# Patient Record
Sex: Male | Born: 1995 | Race: Black or African American | Hispanic: No | Marital: Single | Smoking: Never smoker
Health system: Southern US, Community
[De-identification: ages and names within clinical notes are randomized; demographics above are authoritative.]

## PROBLEM LIST (undated history)

## (undated) DIAGNOSIS — F909 Attention-deficit hyperactivity disorder, unspecified type: Secondary | ICD-10-CM

---
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
## 2016-09-25 IMAGING — CR DG ANKLE COMPLETE 3+V*R*
3 series · 3 of 3 positions shown · non-contrast
Comparison: None.

CLINICAL DATA: Right ankle pain post injury while playing
basketball, rolled ankle, pain and swelling

EXAM:
RIGHT ANKLE - COMPLETE 3+ VIEW

[x ankle ap right]
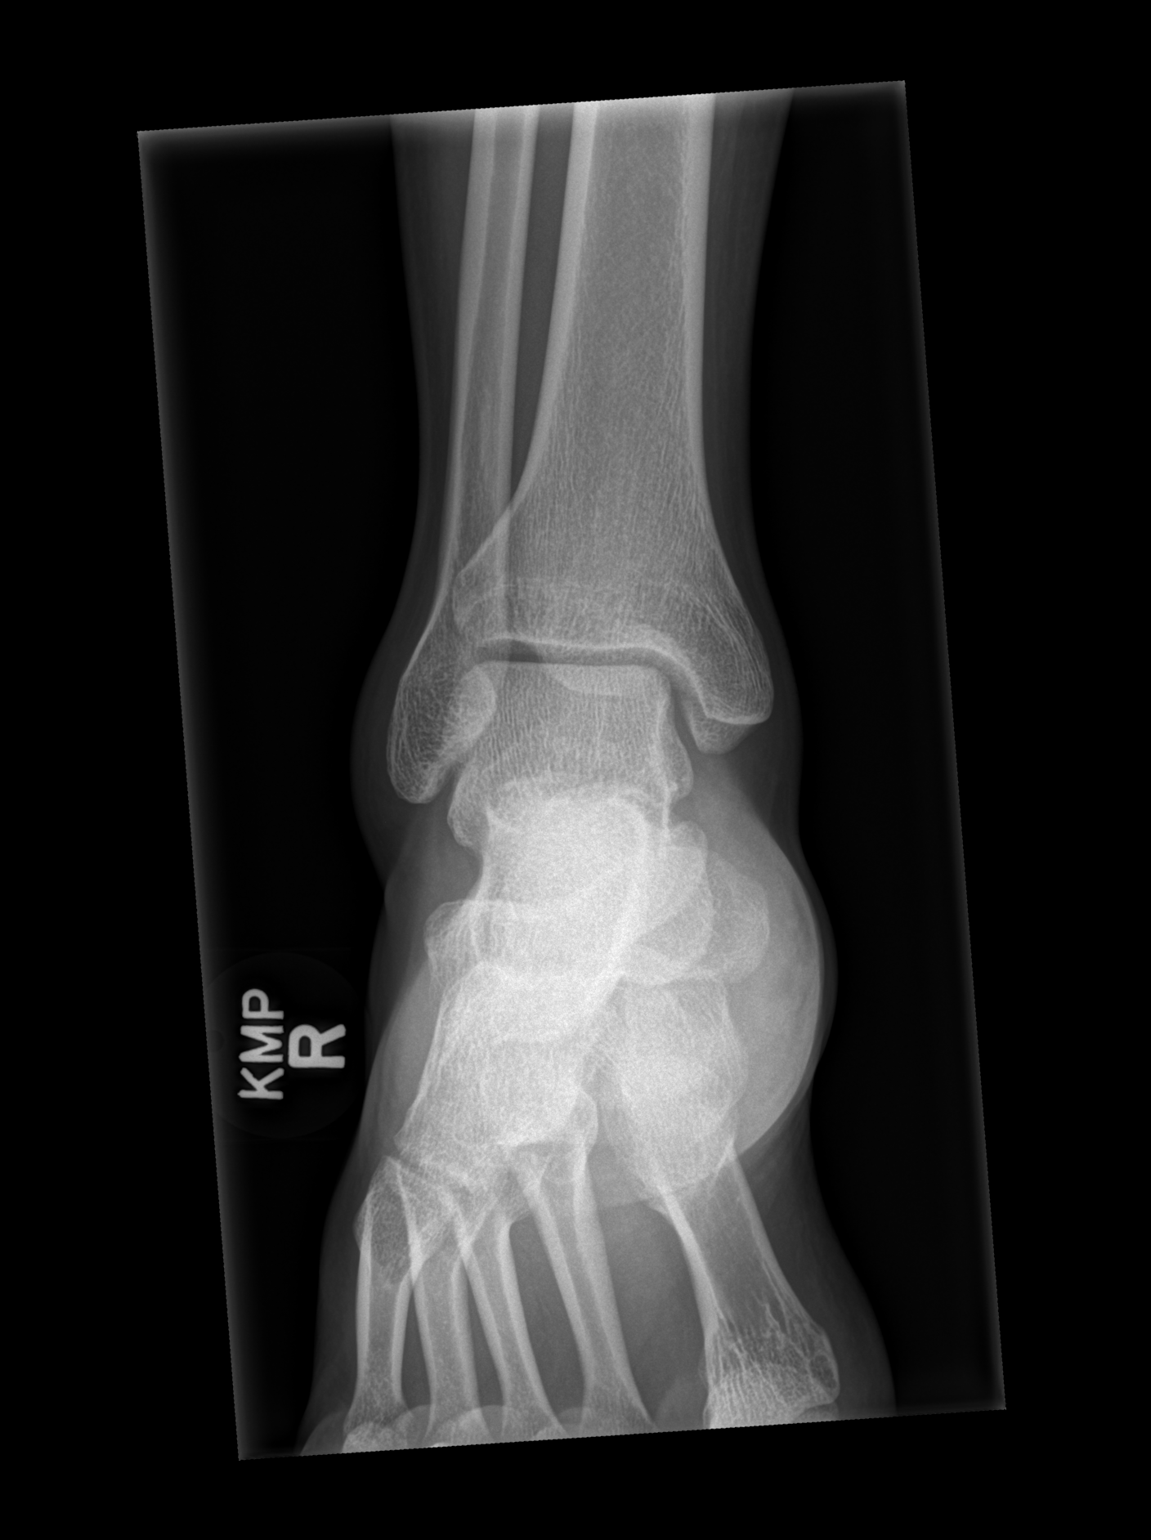

[x ankle obl right]
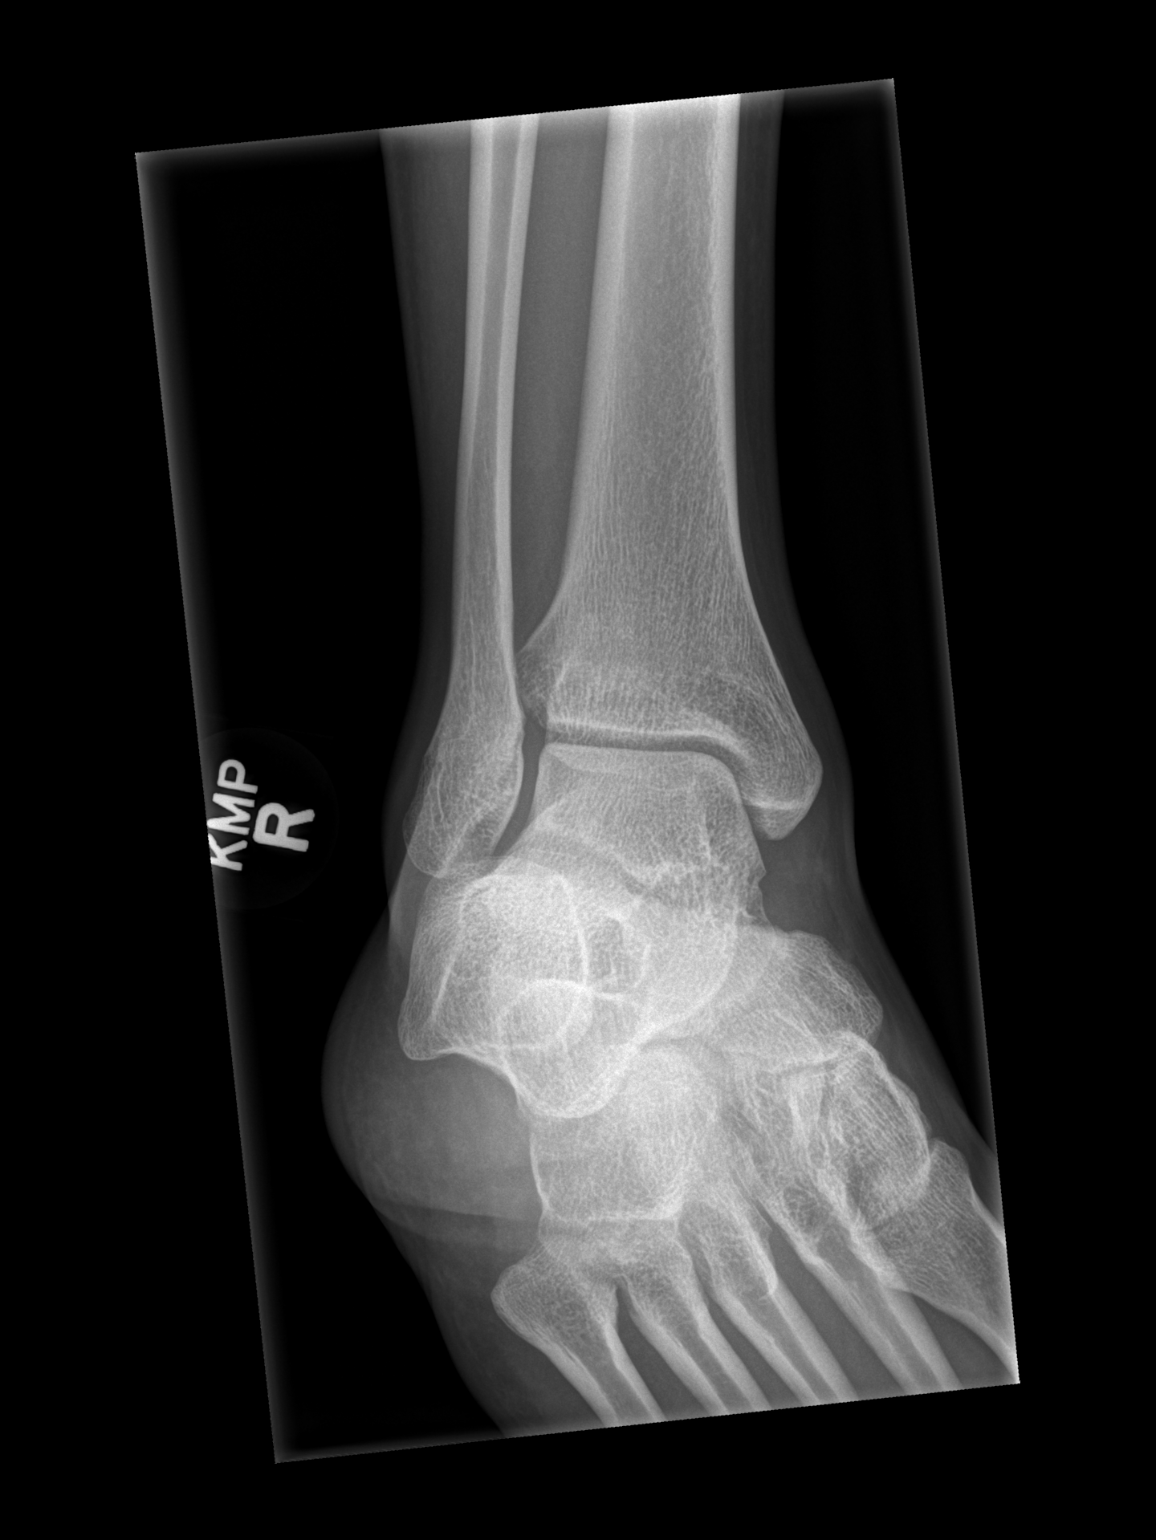

[x ankle lat right]
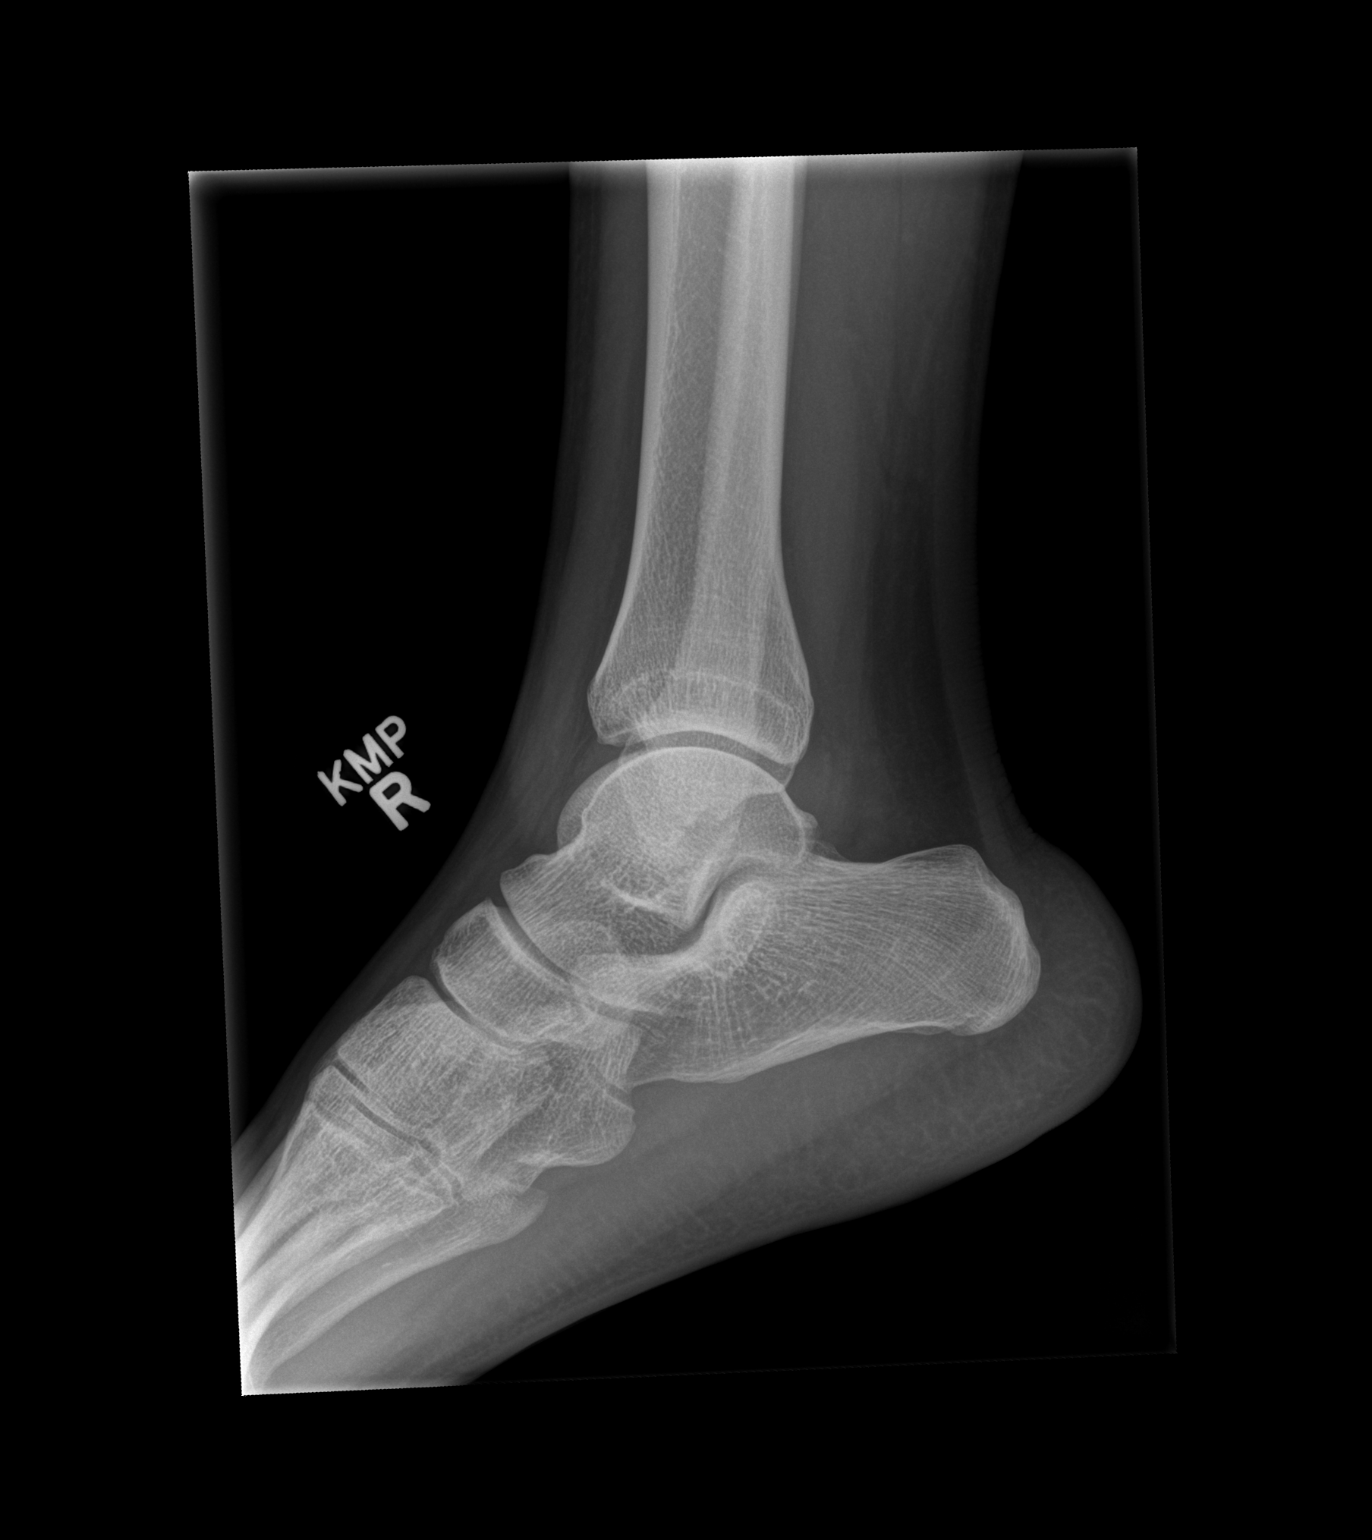

[3 of 3 positions shown; findings below may reference images not displayed]

FINDINGS: Three views of the right ankle submitted. No acute fracture or
subluxation. Ankle mortise is preserved. Mild soft tissue swelling
adjacent to lateral malleolus.
IMPRESSION: No acute fracture or subluxation. Mild soft tissue swelling adjacent
to lateral malleolus.
